# Patient Record
Sex: Male | Born: 2000 | Race: White | Hispanic: No | Marital: Single | Smoking: Never smoker
Health system: Southern US, Community
[De-identification: ages and names within clinical notes are randomized; demographics above are authoritative.]

## PROBLEM LIST (undated history)

## (undated) HISTORY — PX: TONSILLECTOMY: SUR1361

---
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
## 2019-02-06 IMAGING — DX DG CLAVICLE*L*
1 series · 1 of 1 positions shown · non-contrast
Comparison: None.

CLINICAL DATA: Status post ORIF of a distal left clavicle fracture.

EXAM:
LEFT CLAVICLE - 2+ VIEWS

[clavicle ap]
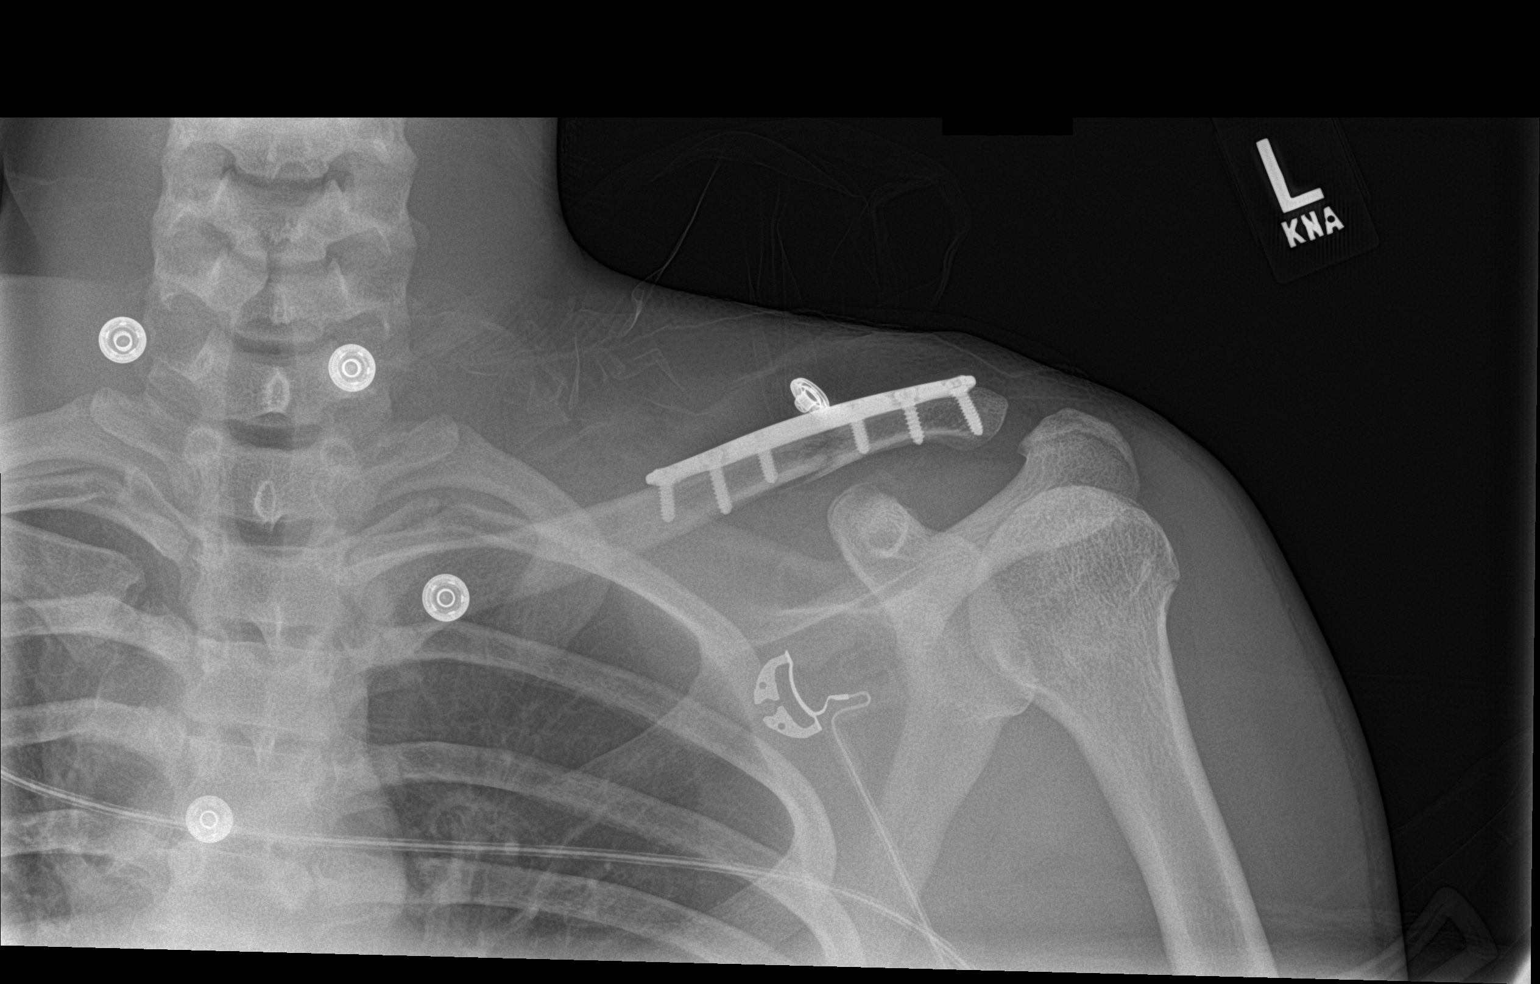

[1 of 1 positions shown; findings below may reference images not displayed]

FINDINGS: Single image shows a superior fixation plate and 6 screws reducing a
distal left clavicle fracture into near anatomic alignment. The AC
and sternoclavicular joints are normally aligned. The orthopedic
hardware appears well-seated.
IMPRESSION: Well-aligned distal left clavicle fracture following ORIF.

## 2023-09-22 ENCOUNTER — Encounter (HOSPITAL_COMMUNITY): Payer: Self-pay

## 2023-09-22 ENCOUNTER — Emergency Department (HOSPITAL_COMMUNITY)
Admission: EM | Admit: 2023-09-22 | Discharge: 2023-09-22 | Disposition: A | Discharge status: Home / Self Care | Payer: Self-pay | Attending: Emergency Medicine | Admitting: Emergency Medicine

## 2023-09-22 ENCOUNTER — Other Ambulatory Visit: Payer: Self-pay

## 2023-09-22 ENCOUNTER — Emergency Department (HOSPITAL_COMMUNITY): Payer: Self-pay

## 2023-09-22 DIAGNOSIS — S22000A Wedge compression fracture of unspecified thoracic vertebra, initial encounter for closed fracture: Secondary | ICD-10-CM | POA: Insufficient documentation

## 2023-09-22 DIAGNOSIS — R519 Headache, unspecified: Secondary | ICD-10-CM | POA: Insufficient documentation

## 2023-09-22 DIAGNOSIS — S12501A Unspecified nondisplaced fracture of sixth cervical vertebra, initial encounter for closed fracture: Secondary | ICD-10-CM | POA: Insufficient documentation
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
## 2023-09-22 NOTE — Discharge Instructions (Addendum)
 You were seen today for nondisplaced fracture of the C6 vertebra as well as possible compression fractures to the T5 and T6 vertebra.  For this reason you must wear c-collar until you are evaluated by neurosurgery.  You must wear this both day and night, only taking this off for showers.  You can continue to use Tylenol  and ibuprofen as additional pain relief.  Take Tylenol  (acetominophen)  650mg  every 4-6 hours, as needed for pain or fever. Do not take more than 4,000 mg in a 24-hour period. As this may cause liver damage. While this is rare, if you begin to develop yellowing of the skin or eyes, stop taking and return to ER immediately.  Take Ibuprofen 400mg  every 4-6 hours for pain or fever, not exceeding 3,200 mg per day as more than 3,200mg  can cause Stomach irritation, dizziness, kidney issues with long-term use. Return to the ED if you intervene new or worsening symptoms.

## 2023-11-03 ENCOUNTER — Other Ambulatory Visit (HOSPITAL_COMMUNITY): Payer: Self-pay | Admitting: Neurosurgery

## 2023-11-03 DIAGNOSIS — S12501A Unspecified nondisplaced fracture of sixth cervical vertebra, initial encounter for closed fracture: Secondary | ICD-10-CM
